# Patient Record
Sex: Female | Born: 1960 | Race: White | Hispanic: No | Marital: Married | Smoking: Never smoker
Health system: Southern US, Community
[De-identification: ages and names within clinical notes are randomized; demographics above are authoritative.]

## PROBLEM LIST (undated history)

## (undated) DIAGNOSIS — S83249A Other tear of medial meniscus, current injury, unspecified knee, initial encounter: Secondary | ICD-10-CM

## (undated) DIAGNOSIS — M1990 Unspecified osteoarthritis, unspecified site: Secondary | ICD-10-CM

## (undated) DIAGNOSIS — E039 Hypothyroidism, unspecified: Secondary | ICD-10-CM

## (undated) DIAGNOSIS — M419 Scoliosis, unspecified: Secondary | ICD-10-CM

## (undated) HISTORY — PX: CYSTOSCOPY: SUR368

## (undated) HISTORY — PX: BACK SURGERY: SHX140

---

## 1985-08-03 HISTORY — PX: CHOLECYSTECTOMY: SHX55

## 1997-11-26 ENCOUNTER — Other Ambulatory Visit: Admission: RE | Admit: 1997-11-26 | Discharge: 1997-11-26 | Payer: Self-pay | Admitting: Obstetrics & Gynecology

## 1998-07-10 ENCOUNTER — Inpatient Hospital Stay (HOSPITAL_COMMUNITY): Admission: AD | Admit: 1998-07-10 | Discharge: 1998-07-10 | Payer: Self-pay | Admitting: Obstetrics & Gynecology

## 1998-07-11 ENCOUNTER — Inpatient Hospital Stay (HOSPITAL_COMMUNITY): Admission: AD | Admit: 1998-07-11 | Discharge: 1998-07-13 | Payer: Self-pay | Admitting: Obstetrics and Gynecology

## 1998-12-02 ENCOUNTER — Other Ambulatory Visit: Admission: RE | Admit: 1998-12-02 | Discharge: 1998-12-02 | Payer: Self-pay | Admitting: Obstetrics and Gynecology

## 2000-01-15 ENCOUNTER — Other Ambulatory Visit: Admission: RE | Admit: 2000-01-15 | Discharge: 2000-01-15 | Payer: Self-pay | Admitting: Obstetrics and Gynecology

## 2000-04-21 ENCOUNTER — Ambulatory Visit (HOSPITAL_COMMUNITY): Admission: RE | Admit: 2000-04-21 | Discharge: 2000-04-21 | Payer: Self-pay | Admitting: Obstetrics and Gynecology

## 2000-04-21 ENCOUNTER — Encounter: Payer: Self-pay | Admitting: Obstetrics and Gynecology

## 2000-08-05 ENCOUNTER — Inpatient Hospital Stay (HOSPITAL_COMMUNITY): Admission: AD | Admit: 2000-08-05 | Discharge: 2000-08-09 | Payer: Self-pay | Admitting: Obstetrics and Gynecology

## 2000-08-10 ENCOUNTER — Encounter: Admission: RE | Admit: 2000-08-10 | Discharge: 2000-11-08 | Payer: Self-pay | Admitting: Obstetrics and Gynecology

## 2002-01-30 ENCOUNTER — Other Ambulatory Visit: Admission: RE | Admit: 2002-01-30 | Discharge: 2002-01-30 | Payer: Self-pay | Admitting: Obstetrics and Gynecology

## 2003-08-14 ENCOUNTER — Other Ambulatory Visit: Admission: RE | Admit: 2003-08-14 | Discharge: 2003-08-14 | Payer: Self-pay | Admitting: Obstetrics and Gynecology

## 2003-08-14 ENCOUNTER — Encounter: Admission: RE | Admit: 2003-08-14 | Discharge: 2003-08-14 | Payer: Self-pay | Admitting: Obstetrics and Gynecology

## 2004-10-13 ENCOUNTER — Other Ambulatory Visit: Admission: RE | Admit: 2004-10-13 | Discharge: 2004-10-13 | Payer: Self-pay | Admitting: Obstetrics and Gynecology

## 2009-10-29 ENCOUNTER — Encounter: Admission: RE | Admit: 2009-10-29 | Discharge: 2009-10-29 | Payer: Self-pay | Admitting: Family Medicine

## 2009-11-12 ENCOUNTER — Other Ambulatory Visit: Admission: RE | Admit: 2009-11-12 | Discharge: 2009-11-12 | Payer: Self-pay | Admitting: Family Medicine

## 2010-08-24 ENCOUNTER — Encounter: Payer: Self-pay | Admitting: Family Medicine

## 2010-10-22 ENCOUNTER — Other Ambulatory Visit: Payer: Self-pay | Admitting: Family Medicine

## 2010-10-22 DIAGNOSIS — Z1231 Encounter for screening mammogram for malignant neoplasm of breast: Secondary | ICD-10-CM

## 2010-11-10 ENCOUNTER — Ambulatory Visit
Admission: RE | Admit: 2010-11-10 | Discharge: 2010-11-10 | Disposition: A | Discharge status: Home / Self Care | Payer: BC Managed Care – PPO | Source: Ambulatory Visit | Attending: Family Medicine | Admitting: Family Medicine

## 2010-11-10 DIAGNOSIS — Z1231 Encounter for screening mammogram for malignant neoplasm of breast: Secondary | ICD-10-CM

## 2010-11-11 ENCOUNTER — Other Ambulatory Visit: Payer: Self-pay | Admitting: Family Medicine

## 2010-11-11 DIAGNOSIS — R928 Other abnormal and inconclusive findings on diagnostic imaging of breast: Secondary | ICD-10-CM

## 2010-11-12 ENCOUNTER — Ambulatory Visit
Admission: RE | Admit: 2010-11-12 | Discharge: 2010-11-12 | Disposition: A | Discharge status: Home / Self Care | Payer: BC Managed Care – PPO | Source: Ambulatory Visit | Attending: Family Medicine | Admitting: Family Medicine

## 2010-11-12 DIAGNOSIS — R928 Other abnormal and inconclusive findings on diagnostic imaging of breast: Secondary | ICD-10-CM

## 2010-11-17 ENCOUNTER — Other Ambulatory Visit: Payer: BC Managed Care – PPO

## 2010-12-19 NOTE — Discharge Summary (Signed)
[facility]  Patient:    Carly Patrick, Carly Patrick                   MRN: [id number] Adm. Date:  [id number] Disc. Date: [id number] Attending:  Osborn Coho Dictator:   Leilani Able, P.A.                           Discharge Summary  FINAL DIAGNOSES:              Intrauterine pregnancy at 56 6/[redacted] weeks gestation, history of prior cesarean section x 3, history of spinal surgery, patient is not a candidate for spinal anesthetic.  PROCEDURE:                    Repeat low transverse cesarean section.  SURGEON:                      Mark E. Dareen Piano, M.D.  ASSISTANT:                    Luvenia Redden, M.D.  ANESTHESIA:                   General endotracheal.  COMPLICATIONS:                None.  HISTORY:                      This 50 year old G5, P3-0-1-3 presents at 43 6/[redacted] weeks gestation for repeat cesarean section.  Patient has had a history of cesarean sections x 3.  Her prenatal course had been complicated by advanced maternal age.  Patient did decline amniocentesis but AFP was performed that was normal.  Patient also has history of spinal surgery and is not a candidate for spinal anesthesia.  She is admitted at this time for repeat cesarean section.  She is taken to the operating room on September 01, 2000 by Dr. Malva Limes where a repeat low transverse cesarean section is performed with the delivery of an 8 pound 11 ounce female infant with Apgars of 7 and 9.  Delivery went without complication.  Patients postoperative course was benign without significant fevers.  The baby was sent to the NICU for some breathing trouble. The baby was diagnosed with RDS.  By postoperative day #4 when patient was sent home the baby was stable.  She was sent home on a regular diet.  Told to decrease activities.  Told to continue prenatal vitamins.  Was given Tylox #30 one to two q.4h. as needed for pain.  Told to continue over-the-counter pain medicines and to  follow-up in the office in four weeks.  Patient is Rh negative, but did not receive RhoGAM because the baby was Rh negative as well. Baby, of course, is not discharged home with the mother. DD:  09/01/00 TD:  09/01/00 Job: [id number]

## 2010-12-19 NOTE — Op Note (Signed)
[facility]  Patient:    Carly Patrick, Carly Patrick                     MRN: [id number] Proc. Date: 08/05/00 Attending:  Janeece Riggers. Dareen Piano, M.D.                           Operative Report  PREOPERATIVE DIAGNOSES:       1. Intrauterine pregnancy at 38-6/7th weeks.                               2. History of prior cesarean section x 3.                               3. History of spinal surgery.  The patient                                  is not a candidate for spinal anesthetic.  POSTOPERATIVE DIAGNOSES:      1. Intrauterine pregnancy at 38-6/7th weeks.                               2. History of prior cesarean section x 3.                               3. History of spinal surgery.  The patient                                  is not a candidate for spinal anesthetic.  PROCEDURE:                    Repeat low transverse cesarean section.  SURGEON:                      Mark E. Dareen Piano, M.D.  ASSISTANT:                    Luvenia Redden, M.D.  ANESTHESIA:                   General endotracheal.  ESTIMATED BLOOD LOSS:         900 cc.  DRAINS:                       Foley to bedside drainage.  ANTIBIOTICS:                  Ancef 1 g.  SPECIMENS:                    None.  COMPLICATIONS:                None.  FINDINGS:                     The patient had normal-appearing fallopian tubes and ovaries.  The uterus appeared to be normal.  There was no evidence of any abdominal wall hernia.  DESCRIPTION OF PROCEDURE:     The patient was taken to the operating room where she  was prepped with Hibiclens and draped in the usual fashion for this procedure.  A Foley catheter was placed.  The patient then had a general endotracheal anesthetic administered.  A Pfannenstiel incision was made through the previous scar.  This was carried down to the fascia.  The fascia was entered in the midline and extended laterally.  The rectus muscles were then sharply dissected  from the fascia.  The rectus muscles were sharply divided in the middle, and taken superiorly and inferiorly.  The parietal peritoneum was entered sharply.  The bladder flap was taken down sharply.  A low transverse uterine incision was made in the midline and extended laterally.  The amniotic sac was entered sharply.  The amniotic fluid was clear.  The infant was delivered in the vertex presentation.  Once the delivery of the head was performed, the oropharynx and nostrils were bulb-suctioned.  The remaining infant was then delivered.  The cord was doubly clamped and cut, and the infant handed to the waiting NICU team.  The cord blood was then obtained.  The placenta was then manually removed.  The uterus was exteriorized.  The uterine cavity was wiped with a wet lap.  The uterine incision was closed in a single layer with #0 in a running locking fashion. There was a small extension on the right towards the cervix.  This was closed with the same suture.  The bladder flap was not closed.  The posterior cul-de-sac was suctioned.  The fallopian tubes and ovaries were examined, and appeared to be normal.  The uterus was placed back in the abdominal cavity. The abdominal gutters were wiped with a wet lap.  The bladder was inspected and appeared to be normal.  The parietal peritoneum and rectus muscles were reapproximated in the midline using #3-0 chromic in a running fashion.  The patient has been complaining of some right lower quadrant pain near the incision.  It was felt that the patient may have a hernia.  An extensive examination revealed no evidence of any abdominal wall defects or adhesions in this area.  At this point the fascia was closed using #0 Monocryl suture in a running fashion, beginning at each angle, and meeting in the midline.  The subcuticular tissue was made hemostatic with a Bovie.  The subcuticular tissue was closed with interrupted #2-0 plain gut sutures.  Stainless steel  clips were used to close the skin.  The patient tolerated the procedure well.  She was taken to the recovery room in stable condition.  The instrument and lap counts were correct x 2. DD:  08/05/00 TD:  08/05/00 Job: [id number]

## 2011-10-22 ENCOUNTER — Other Ambulatory Visit: Payer: Self-pay | Admitting: Family Medicine

## 2011-10-22 DIAGNOSIS — Z1231 Encounter for screening mammogram for malignant neoplasm of breast: Secondary | ICD-10-CM

## 2011-11-02 ENCOUNTER — Other Ambulatory Visit: Payer: Self-pay | Admitting: Family Medicine

## 2011-11-02 ENCOUNTER — Other Ambulatory Visit (HOSPITAL_COMMUNITY)
Admission: RE | Admit: 2011-11-02 | Discharge: 2011-11-02 | Disposition: A | Discharge status: Home / Self Care | Payer: BC Managed Care – PPO | Source: Ambulatory Visit | Attending: Family Medicine | Admitting: Family Medicine

## 2011-11-02 DIAGNOSIS — Z0000 Encounter for general adult medical examination without abnormal findings: Secondary | ICD-10-CM | POA: Insufficient documentation

## 2011-11-16 ENCOUNTER — Ambulatory Visit
Admission: RE | Admit: 2011-11-16 | Discharge: 2011-11-16 | Disposition: A | Discharge status: Home / Self Care | Payer: BC Managed Care – PPO | Source: Ambulatory Visit | Attending: Family Medicine | Admitting: Family Medicine

## 2011-11-16 DIAGNOSIS — Z1231 Encounter for screening mammogram for malignant neoplasm of breast: Secondary | ICD-10-CM

## 2011-12-03 ENCOUNTER — Encounter: Payer: Self-pay | Admitting: Family Medicine

## 2011-12-03 ENCOUNTER — Ambulatory Visit (INDEPENDENT_AMBULATORY_CARE_PROVIDER_SITE_OTHER): Payer: BC Managed Care – PPO | Admitting: Family Medicine

## 2011-12-03 VITALS — Ht 61.5 in | Wt 263.7 lb

## 2011-12-03 DIAGNOSIS — E785 Hyperlipidemia, unspecified: Secondary | ICD-10-CM | POA: Insufficient documentation

## 2011-12-03 DIAGNOSIS — R030 Elevated blood-pressure reading, without diagnosis of hypertension: Secondary | ICD-10-CM | POA: Insufficient documentation

## 2011-12-03 NOTE — Patient Instructions (Addendum)
-   Fat-free milk is the best choice for you.   - You have to do it yourself, AND you can't do it alone.    - Find a walking partner, adn set up a consistent time to walk with her.   - 16 oz of sweet tea provides ~45 g sugar.  AHA recommends women limit their added sugar intake to 25 grams a day (men = 37.5 g/day).    - 1 tsp sugar = 4 grams.   - TASTE PREFERENCES ARE LEARNED.  - Eating for non-hunger reasons:  HAALT:  Hungry, Angry, Anxious, Lonely, Tired.  (And Bored / Depressed.)   - Press the PAUSE button:  What do I really want?  - Physical activity:  Watch [employer] Truth about Exercise: [email or web address]  - Pay attn to especially:  Fayrene Fearing Levine's NEAT (non-exercise activity thermogenesis); and HIT (high-intensity training).    - How can you build in some NEAT? Goals:  1. Keep a daily food record.    2. Walk or do Zumba at least 30 min 5 X wk.  (Remember the 5-minute rule.)   - Determine your exercise days for the week ahead, AND PUT THEM ON THE CALENDAR.    3. Limit fluid intake to unsweetened beverages, water, & grapefruit juice IF it's consumed as part of the whole fruit.  (Try mixing grpfrt juice with seltzer.) - Track progress on your GOALS SHEET provided today.    - AT FOLLOW-UP, WE WILL REVIEW A PPT TALK ON EMOTIONAL EATING.

## 2011-12-03 NOTE — Progress Notes (Signed)
Medical Nutrition Therapy:  Appt start time: 0900 end time:  1000.  Assessment:  Primary concerns today: Weight management and hyperlipidemia, pre-DM, and pre-HTN.  Alayla was referred by Dr. Merri Brunette for hyperlipidemia and obesity mgmt.  Azyria is also pre-hypertensive.  She is a Museum/gallery exhibitions officer, and works from home, usually typing 5-6 hrs/day, often typing at night.  She has 4 children, the youngest of whom is 70.  Time constraints often get in the way of more exercise, but she admits that she is not always motivated to exercise more than the Zumba classes she does a couple times a week.  Tonesha's husband is morbidly obese, and shows little interest in health behavior changes.  Her two oldest boys are runners, who don't like skim milk, and often request foods like ice cream.  Velna has lost weight before (once >30 lb), but has never sustained healthy eating and activity behaviors.  She does seem highly motivated at this time, however.  She likes to cook, for which she does make time, and she seems to have a good knowledge of nutrition, although limited insight re. health behavior change processes.    Usual eating pattern includes 3 meals and 1-2 snacks per day.  Usual physical activity includes Zumba 2 X wk.  Ernestine has scoliosis for which she has had a spinal rod placed, so cannot run, but she recently got a book and MP3 player for walking.  She also has a recumbent bike and TM at home.    Everyday foods include 24-48 oz sweet tea (1.5 tbsp sugar/8 oz), hot green tea, eggs 3 X wk, apples, grapefruit.     24-hr recall:  B (8 AM)-   1 grapefruit, 1 1/2 c Raisin Bran, 2 c 1% milk, green tea,  Snk (10 AM)-   Yoplait light (90 kcal) L (1 PM)-  [place] sandwich on whlwht, provolone, let, mustard, Ital dressing, >1 c pretzels, 10 baby carrots, apple Snk ( PM)-  none D (6:30 PM)-  2 c soup (chx enchilada and a wild rice chx and minestrone), saltines, 1/2 grilled chs sandw, 5 oz sweet  tea Snk (9:30)-  small slice cake  Progress Towards Goal(s):  In progress.   Nutritional Diagnosis:  NB-2.1 Physical inactivity As related to poor motivation and time consttraints.  As evidenced by formal exercise only 2 X wk and sedentary job. NI-1.5 Excessive energy intake As related to expenditure.  As evidenced by BMI>49.    Intervention:  Nutrition education.  Monitoring/Evaluation:  Dietary intake, exercise, and body weight in 1 month(s).

## 2012-01-11 ENCOUNTER — Encounter: Payer: Self-pay | Admitting: Family Medicine

## 2012-01-11 ENCOUNTER — Ambulatory Visit (INDEPENDENT_AMBULATORY_CARE_PROVIDER_SITE_OTHER): Payer: BC Managed Care – PPO | Admitting: Family Medicine

## 2012-01-11 VITALS — Ht 61.5 in | Wt 262.3 lb

## 2012-01-11 DIAGNOSIS — R030 Elevated blood-pressure reading, without diagnosis of hypertension: Secondary | ICD-10-CM

## 2012-01-11 NOTE — Progress Notes (Signed)
Medical Nutrition Therapy:  Appt start time: 1000 end time:  1100.  Assessment:  Primary concerns today: Weight management and hyperlipidemia, pre-DM, and pre-HTN.  Carly Patrick got her TSH checked May 28, which was somewhat reduced (5.29), still on high end of normal.  Carly Patrick has done well with her goals of water instead of sweet tea, and she is wakling and doing Zumba.  She walks 90 minute 3 X wk, and 60 min Zumba 2 X wk.  She said she is feeling much better, and already noticed an increase in her stamina.   24-hr recall:  B ( AM)-   None (traveling back from [facility]) Brunch (10:30 AM)-  [employer] chx fried breast (skin removed), 1/2 biscuit w/ 1/2 tsp jelly, 1/4 c slaw, 1/2 c mashed potatoes w/ gravy, unsweetened tea L-    None; went to graduation at 1 PM Snk (3:30 PM)-  1 slice banana bread, 10-12 pecans, water  D (4:30 PM)-   1 hamburger patty, pickles, mustard, ketchup, water Snk (8 PM)-   1 c watermelon balls, 1 oatmeal cookie Yesterday was atypical b/c of driving home from family reunion, attending graduation, and church service at 6:30.    Progress Towards Goal(s):  In progress.   Nutritional Diagnosis:  Progress noted on both goals: NB-2.1 Physical inactivity As related to poor motivation and time consttraints.  As evidenced by Zumba 2 X wk and 90 min walking 3 X wk. NI-1.5 Excessive energy intake As related to expenditure.  As evidenced by wt loss of 1.4 lb.    Intervention:  Nutrition education.  Monitoring/Evaluation:  Dietary intake, exercise, and body weight in 1 month.

## 2012-01-11 NOTE — Patient Instructions (Signed)
-   MAKE FOOD A PRIORITY WHEREVER YOU GO.    - Plan for breakfast when traveling or when out of normal schedule, i.e., what can you bring from home?.  - Yogurt, peanut butter, whole-wheat bread/crackers, fresh fruit, string cheese.   - Scientist, research (physical sciences) for acces to emotional eating ppt on [employer]. - Great job on your goals so far.  Try to get more consistent with your food record, and continue completing goals sheet.    - Keeping a food record will help you monitor what, how much, and (importantly:) WHEN you eat.   - ADDITIONAL GOAL:  Plan ahead for meals and snacks, especially when the schedule is extra busy.

## 2012-02-15 ENCOUNTER — Ambulatory Visit: Payer: BC Managed Care – PPO | Admitting: Family Medicine

## 2012-03-28 ENCOUNTER — Ambulatory Visit (INDEPENDENT_AMBULATORY_CARE_PROVIDER_SITE_OTHER): Payer: BC Managed Care – PPO | Admitting: Family Medicine

## 2012-03-28 ENCOUNTER — Encounter: Payer: Self-pay | Admitting: Family Medicine

## 2012-03-28 VITALS — Ht 61.5 in | Wt 262.6 lb

## 2012-03-28 DIAGNOSIS — E785 Hyperlipidemia, unspecified: Secondary | ICD-10-CM

## 2012-03-28 DIAGNOSIS — R030 Elevated blood-pressure reading, without diagnosis of hypertension: Secondary | ICD-10-CM

## 2012-03-28 NOTE — Patient Instructions (Addendum)
-   You need more sleep!  Is there a way to pass on some of your responsibilities to someone else?  - Give some thought to what you can let go of, but not those activities you especially enjoy.    - Remember that no one knows how you feel unless you tell them.    - Saying NO:  "I promised my family I would not take on one more project."  Or "I would love to help, but am swamped at this time.  Please keep me on your list to call in the future."  - Or at least promise yourself that you will not say yes to ANYTHING without discussing it with your husband.    - Yesterday's food intake:  - NOT ENOUGH FOOD!  You may have been able to resist cake and punch had you eaten adequate breakfast and lunch.    - NOT ENOUGH VEGETABLES!  No fruit!  - Plan ahead for meals and snacks, considering what is on the schedule for the day.   - Goals:  1. Keep a daily food record.    2. Walk or do Zumba at least 30 min 4 X wk.  (Remember the 5-minute rule.)   3. Limit fluid intake to unsweetened beverages and water.  - Let me know if you would like to take the Weigh to Wellness Class that starts Sept 10 (5:30-6:30 PM at [facility]).

## 2012-03-28 NOTE — Progress Notes (Signed)
Medical Nutrition Therapy:  Appt start time: 1000 end time:  1100.  Assessment:  Primary concerns today: Weight management and hyperlipidemia, pre-DM, and pre-HTN.  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  She has done well with her water intake and unsweetened tea.  She has also been bicycling some, and is still doing a Zumba class 1 X wk, and has walked ~45 min 1 X wk.  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  It sounds like Carly Patrick's family is very dependent on her for day-to-day functioning.    24-hr recall:  Up ~9 AM B (9:30 AM)-  1/2 chx breast, water Snk ( AM)-  none L ( PM)-  1/2 chx breast, water Snk (4 PM)-  Slice of cake, 12 oz punch (at church concert reception) D (7:30 PM)-  1/2 c spaghetti w/ marinara, salad w/ 1 tbsp dressing, 1/2 grilled cheese sandw, water Snk ( PM)-  none  Progress Towards Goal(s):  In progress.   Nutritional Diagnosis:    NB-2.1 Physical inactivity As related to poor motivation and time consttraints.  As evidenced by Zumba 2 X wk and 90 min walking 3 X wk. NI-1.5 Excessive energy intake As related to expenditure.  As evidenced by wt loss of 1.4 lb.    Intervention:  Nutrition education.  Monitoring/Evaluation:  Dietary intake, exercise, and body weight in 1 month.

## 2012-05-02 ENCOUNTER — Encounter: Payer: Self-pay | Admitting: Family Medicine

## 2012-05-02 ENCOUNTER — Ambulatory Visit (INDEPENDENT_AMBULATORY_CARE_PROVIDER_SITE_OTHER): Payer: BC Managed Care – PPO | Admitting: Family Medicine

## 2012-05-02 VITALS — Ht 61.5 in | Wt 257.4 lb

## 2012-05-02 DIAGNOSIS — E785 Hyperlipidemia, unspecified: Secondary | ICD-10-CM

## 2012-05-02 DIAGNOSIS — R030 Elevated blood-pressure reading, without diagnosis of hypertension: Secondary | ICD-10-CM

## 2012-05-02 DIAGNOSIS — E66813 Obesity, class 3: Secondary | ICD-10-CM

## 2012-05-02 NOTE — Progress Notes (Signed)
Medical Nutrition Therapy:  Appt start time: 1000 end time:  1100.  Assessment:  Primary concerns today: Weight management and hyperlipidemia, pre-DM, and pre-HTN.  Carly Patrick has been keeping up with her goals sheet and in taking care of herself.  She is getting more sleep, is exercising more than her goal of 4 X wk, keeping a food record daily, and she has been limiting beverages to water and unsweetened tea (not even diet drinks).  She has been making an effort to eat more protein and to limit carb's.   Carly Patrick would like to increase her exercise intensity, and is motivated to work hard.    24-hr recall:  B (9 AM)-  1 fried egg (in spray oil), 10 grapes, 1/2 orange, green tea Snk ( AM)-  none L (12:45 PM)-  Large salad w/ balsamic vinegar, 1 c spaghetti w/ meat sauce, water Snk (2:30)-  Activia yogurt, 10 almonds Snk (6:30)-  Handful of popcorn, water, unsweetenedtea D (7:45 PM)-  Salad, [place], tomato, cel, car, cuc, onion, shredded chs, 4 saltines, water Snk ( PM)-  none  Progress Towards Goal(s):  In progress.   Nutritional Diagnosis:  Excellent progress on both goals:  NB-2.1 Physical inactivity As related to time constraints.  As evidenced by Zumba 2 X wk and 90 min walking at least 3 X wk. NI-1.5 Excessive energy intake As related to expenditure.  As evidenced by wt loss of >5 lb.    Intervention:  Nutrition education.  Monitoring/Evaluation:  Dietary intake, exercise, and body weight in 1 month.

## 2012-05-02 NOTE — Patient Instructions (Addendum)
-   Look into finding a tennis partner, and setting up a consistent time to play.   - Increase exercise intensity on days you walk, i.e., 30-sec bursts.  Have a workout plan in mind before you start.   - Continue to keep daily food records, and to complete goals sheet.  - Call or email for Dec appt:  [phone] / [email or web address].  - If you decide you want an Oct appt, call asap to schedule.

## 2013-10-10 ENCOUNTER — Other Ambulatory Visit: Payer: Self-pay

## 2013-10-10 DIAGNOSIS — Z1231 Encounter for screening mammogram for malignant neoplasm of breast: Secondary | ICD-10-CM

## 2013-10-30 ENCOUNTER — Ambulatory Visit
Admission: RE | Admit: 2013-10-30 | Discharge: 2013-10-30 | Disposition: A | Discharge status: Home / Self Care | Payer: Self-pay | Source: Ambulatory Visit

## 2013-10-30 DIAGNOSIS — Z1231 Encounter for screening mammogram for malignant neoplasm of breast: Secondary | ICD-10-CM

## 2014-05-23 ENCOUNTER — Other Ambulatory Visit: Payer: Self-pay | Admitting: Family Medicine

## 2014-05-23 ENCOUNTER — Other Ambulatory Visit (HOSPITAL_COMMUNITY)
Admission: RE | Admit: 2014-05-23 | Discharge: 2014-05-23 | Disposition: A | Discharge status: Home / Self Care | Payer: BC Managed Care – PPO | Source: Ambulatory Visit | Attending: Family Medicine | Admitting: Family Medicine

## 2014-05-23 DIAGNOSIS — Z0000 Encounter for general adult medical examination without abnormal findings: Secondary | ICD-10-CM | POA: Diagnosis not present

## 2014-05-25 LAB — CYTOLOGY - PAP

## 2015-08-09 ENCOUNTER — Other Ambulatory Visit: Payer: Self-pay

## 2015-08-09 DIAGNOSIS — Z1231 Encounter for screening mammogram for malignant neoplasm of breast: Secondary | ICD-10-CM

## 2015-09-16 ENCOUNTER — Ambulatory Visit
Admission: RE | Admit: 2015-09-16 | Discharge: 2015-09-16 | Disposition: A | Discharge status: Home / Self Care | Payer: BC Managed Care – PPO | Source: Ambulatory Visit

## 2015-09-16 DIAGNOSIS — Z1231 Encounter for screening mammogram for malignant neoplasm of breast: Secondary | ICD-10-CM

## 2016-08-10 ENCOUNTER — Other Ambulatory Visit: Payer: Self-pay | Admitting: Family Medicine

## 2016-08-10 ENCOUNTER — Other Ambulatory Visit (HOSPITAL_COMMUNITY)
Admission: RE | Admit: 2016-08-10 | Discharge: 2016-08-10 | Disposition: A | Discharge status: Home / Self Care | Payer: BC Managed Care – PPO | Source: Ambulatory Visit | Attending: Family Medicine | Admitting: Family Medicine

## 2016-08-10 DIAGNOSIS — Z01419 Encounter for gynecological examination (general) (routine) without abnormal findings: Secondary | ICD-10-CM | POA: Diagnosis not present

## 2016-08-12 LAB — CYTOLOGY - PAP
ADEQUACY: ABSENT
Diagnosis: NEGATIVE

## 2016-08-24 ENCOUNTER — Other Ambulatory Visit: Payer: Self-pay | Admitting: Family Medicine

## 2016-08-24 DIAGNOSIS — S32020S Wedge compression fracture of second lumbar vertebra, sequela: Secondary | ICD-10-CM

## 2016-10-05 ENCOUNTER — Ambulatory Visit
Admission: RE | Admit: 2016-10-05 | Discharge: 2016-10-05 | Disposition: A | Discharge status: Home / Self Care | Payer: BC Managed Care – PPO | Source: Ambulatory Visit | Attending: Family Medicine | Admitting: Family Medicine

## 2016-10-05 DIAGNOSIS — S32020S Wedge compression fracture of second lumbar vertebra, sequela: Secondary | ICD-10-CM

## 2016-10-20 ENCOUNTER — Encounter (HOSPITAL_COMMUNITY): Payer: Self-pay | Admitting: *Deleted

## 2016-10-22 ENCOUNTER — Ambulatory Visit (HOSPITAL_COMMUNITY): Payer: BC Managed Care – PPO | Admitting: Anesthesiology

## 2016-10-22 ENCOUNTER — Ambulatory Visit (HOSPITAL_COMMUNITY)
Admission: RE | Admit: 2016-10-22 | Discharge: 2016-10-22 | Disposition: A | Discharge status: Home / Self Care | Payer: BC Managed Care – PPO | Source: Ambulatory Visit | Attending: Gastroenterology | Admitting: Gastroenterology

## 2016-10-22 ENCOUNTER — Encounter (HOSPITAL_COMMUNITY)
Admission: RE | Disposition: A | Discharge status: Home / Self Care | Payer: Self-pay | Source: Ambulatory Visit | Attending: Gastroenterology

## 2016-10-22 ENCOUNTER — Encounter (HOSPITAL_COMMUNITY): Payer: Self-pay | Admitting: Gastroenterology

## 2016-10-22 DIAGNOSIS — Z9049 Acquired absence of other specified parts of digestive tract: Secondary | ICD-10-CM | POA: Insufficient documentation

## 2016-10-22 DIAGNOSIS — D122 Benign neoplasm of ascending colon: Secondary | ICD-10-CM | POA: Diagnosis not present

## 2016-10-22 DIAGNOSIS — E039 Hypothyroidism, unspecified: Secondary | ICD-10-CM | POA: Insufficient documentation

## 2016-10-22 DIAGNOSIS — Z6841 Body Mass Index (BMI) 40.0 and over, adult: Secondary | ICD-10-CM | POA: Insufficient documentation

## 2016-10-22 DIAGNOSIS — M1990 Unspecified osteoarthritis, unspecified site: Secondary | ICD-10-CM | POA: Insufficient documentation

## 2016-10-22 DIAGNOSIS — Z79899 Other long term (current) drug therapy: Secondary | ICD-10-CM | POA: Diagnosis not present

## 2016-10-22 DIAGNOSIS — Z1211 Encounter for screening for malignant neoplasm of colon: Secondary | ICD-10-CM | POA: Insufficient documentation

## 2016-10-22 DIAGNOSIS — K5730 Diverticulosis of large intestine without perforation or abscess without bleeding: Secondary | ICD-10-CM | POA: Diagnosis not present

## 2016-10-22 DIAGNOSIS — M419 Scoliosis, unspecified: Secondary | ICD-10-CM | POA: Insufficient documentation

## 2016-10-22 HISTORY — DX: Hypothyroidism, unspecified: E03.9

## 2016-10-22 HISTORY — DX: Other tear of medial meniscus, current injury, unspecified knee, initial encounter: S83.249A

## 2016-10-22 HISTORY — DX: Scoliosis, unspecified: M41.9

## 2016-10-22 HISTORY — PX: COLONOSCOPY WITH PROPOFOL: SHX5780

## 2016-10-22 HISTORY — DX: Unspecified osteoarthritis, unspecified site: M19.90

## 2016-10-22 SURGERY — COLONOSCOPY WITH PROPOFOL
Anesthesia: Monitor Anesthesia Care

## 2016-10-22 MED ORDER — PROPOFOL 10 MG/ML IV BOLUS
INTRAVENOUS | Status: AC
  Filled 2016-10-22: qty 20

## 2016-10-22 MED ORDER — ONDANSETRON HCL 4 MG/2ML IJ SOLN
INTRAMUSCULAR | Status: DC | PRN
  Administered 2016-10-22: 4 mg via INTRAVENOUS

## 2016-10-22 MED ORDER — PROPOFOL 500 MG/50ML IV EMUL
INTRAVENOUS | Status: DC | PRN
  Administered 2016-10-22: 200 ug/kg/min via INTRAVENOUS

## 2016-10-22 MED ORDER — SODIUM CHLORIDE 0.9 % IV SOLN: INTRAVENOUS | Status: DC

## 2016-10-22 MED ORDER — PROPOFOL 10 MG/ML IV BOLUS
INTRAVENOUS | Status: DC | PRN
  Administered 2016-10-22: 20 mg via INTRAVENOUS

## 2016-10-22 MED ORDER — PROPOFOL 10 MG/ML IV BOLUS
INTRAVENOUS | Status: AC
  Filled 2016-10-22: qty 40

## 2016-10-22 MED ORDER — EPHEDRINE 5 MG/ML INJ
INTRAVENOUS | Status: AC
  Filled 2016-10-22: qty 10

## 2016-10-22 MED ORDER — LACTATED RINGERS IV SOLN
INTRAVENOUS | Status: DC
  Administered 2016-10-22: 1000 mL via INTRAVENOUS

## 2016-10-22 MED ORDER — ONDANSETRON HCL 4 MG/2ML IJ SOLN
INTRAMUSCULAR | Status: AC
  Filled 2016-10-22: qty 2

## 2016-10-22 SURGICAL SUPPLY — 21 items

## 2016-10-22 NOTE — H&P (Signed)
Carly Patrick is an 56 y.o. female.   Chief Complaint:  Screening for colon cancer HPI: Morbidly obese pt w/ no risk factors or LGI sx presents for initial scr colonoscopy  Past Medical History:  Diagnosis Date  . Acute medial meniscus tear    right knee pt to bring pilloe per dr Dealva Lafoy to help  . Arthritis   . Hypothyroidism   . Scoliosis     Past Surgical History:  Procedure Laterality Date  . BACK SURGERY     muliple back surgeries harrington rods placed  . CESAREAN SECTION     x 4  . CHOLECYSTECTOMY  1987  . 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 No pertinent family history. Social History:  reports that she has never smoked. She has never used smokeless tobacco. She reports that she does not drink alcohol or use drugs.  Allergies: No Known Allergies  Medications Prior to Admission  Medication Sig Dispense Refill  . acetaminophen (TYLENOL) 650 MG CR tablet Take 1,300 mg by mouth 2 (two) times daily.    . cetirizine (ZYRTEC) 10 MG tablet Take 10 mg by mouth daily.    . Glucosamine-Chondroitin 750-600 MG TABS Take 1 tablet by mouth 2 (two) times daily.    Marland Kitchen levothyroxine (SYNTHROID, LEVOTHROID) 100 MCG tablet Take 100 mcg by mouth daily before breakfast.    . pyridoxine (B-6) 100 MG tablet Take 100 mg by mouth daily.    . Turmeric 500 MG CAPS Take 1 capsule by mouth daily.    Marland Kitchen triamcinolone cream (KENALOG) 0.1 % Apply 1 application topically 2 (two) times daily as needed (eczema).      No results found for this or any previous visit (from the past 48 hour(s)). No results found.  ROS no chronic cardiopulm sx, but does have bad knee  Blood pressure (!) 170/75, pulse 97, temperature 98 F (36.7 C), temperature source Oral, resp. rate (!) 26, height 5' 1.5" (1.562 m), weight 118.8 kg (262 lb), last menstrual period 01/02/2015, SpO2 97 %. Physical Exam   Pleasant, cognitively intact, NAD, chest clr, heart nl, abd obese but  NT  Assessment/Plan Appropriate for colonoscopy, which will be done at hospital b/o high BMI.  Cleotis Nipper, MD 10/22/2016, 10:41 AM

## 2016-10-22 NOTE — Discharge Instructions (Signed)

## 2016-10-22 NOTE — Anesthesia Preprocedure Evaluation (Signed)
Anesthesia Evaluation  Patient identified by MRN, date of birth, ID band Patient awake    Reviewed: Allergy & Precautions, NPO status , Patient's Chart, lab work & pertinent test results  Airway Mallampati: I  TM Distance: >3 FB Neck ROM: Full    Dental  (+) Teeth Intact   Pulmonary neg pulmonary ROS,    breath sounds clear to auscultation       Cardiovascular negative cardio ROS   Rhythm:Regular Rate:Normal     Neuro/Psych    GI/Hepatic negative GI ROS, Neg liver ROS,   Endo/Other  Hypothyroidism Morbid obesity  Renal/GU negative Renal ROS     Musculoskeletal  (+) Arthritis ,   Abdominal   Peds  Hematology   Anesthesia Other Findings   Reproductive/Obstetrics                             Anesthesia Physical Anesthesia Plan  ASA: II  Anesthesia Plan: MAC   Post-op Pain Management:    Induction:   Airway Management Planned: Natural Airway and Simple Face Mask  Additional Equipment:   Intra-op Plan:   Post-operative Plan:   Informed Consent: I have reviewed the patients History and Physical, chart, labs and discussed the procedure including the risks, benefits and alternatives for the proposed anesthesia with the patient or authorized representative who has indicated his/her understanding and acceptance.   Dental advisory given  Plan Discussed with: CRNA  Anesthesia Plan Comments:         Anesthesia Quick Evaluation

## 2016-10-22 NOTE — Transfer of Care (Signed)
Immediate Anesthesia Transfer of Care Note  Patient: Carly Patrick  Procedure(s) Performed: Procedure(s): COLONOSCOPY WITH PROPOFOL (N/A)  Patient Location: PACU  Anesthesia Type:MAC  Level of Consciousness:  sedated, patient cooperative and responds to stimulation  Airway & Oxygen Therapy:Patient Spontanous Breathing and Patient connected to face mask oxgen  Post-op Assessment:  Report given to PACU RN and Post -op Vital signs reviewed and stable  Post vital signs:  Reviewed and stable  Last Vitals:  Vitals:   10/22/16 1034  BP: (!) 170/75  Pulse: 97  Resp: (!) 26  Temp: 33.8 C    Complications: No apparent anesthesia complications

## 2016-10-22 NOTE — Op Note (Signed)
[facility] Patient Name: Carly Patrick Procedure Date: 10/22/2016 MRN: [id number] Attending MD: Ronald Lobo , MD Date of Birth: 25-Oct-1960 CSN: [id number] Age: 56 Admit Type: Outpatient Procedure:                Colonoscopy Indications:              Screening for colorectal malignant neoplasm, This                            is the patient's first colonoscopy Providers:                Ronald Lobo, MD, Elmer Ramp. Tilden Dome, RN, Corliss Parish, Technician Referring MD:              Medicines:                Monitored Anesthesia Care Complications:            No immediate complications. Estimated Blood Loss:     Estimated blood loss was minimal. Procedure:                Pre-Anesthesia Assessment:                           - Prior to the procedure, a History and Physical                            was performed, and patient medications and                            allergies were reviewed. The patient's tolerance of                            previous anesthesia was also reviewed. The risks                            and benefits of the procedure and the sedation                            options and risks were discussed with the patient.                            All questions were answered, and informed consent                            was obtained. Prior Anticoagulants: The patient has                            taken no previous anticoagulant or antiplatelet                            agents. ASA Grade Assessment: III - A patient with  severe systemic disease. After reviewing the risks                            and benefits, the patient was deemed in                            satisfactory condition to undergo the procedure.                           After obtaining informed consent, the colonoscope                            was passed under direct vision. Throughout the   procedure, the patient's blood pressure, pulse, and                            oxygen saturations were monitored continuously. The                            EC-[id number] ([id number]) scope was introduced through                            the anus and advanced to the the cecum, identified                            by appendiceal orifice and ileocecal valve. The                            colonoscopy was somewhat difficult due to                            significant looping. Successful completion of the                            procedure was aided by using manual pressure. The                            patient tolerated the procedure well. The quality                            of the bowel preparation was excellent. The                            appendiceal orifice and the rectum were                            photographed. Scope In: 11:49:00 AM Scope Out: 24:23:53 PM Scope Withdrawal Time: 0 hours 12 minutes 16 seconds  Total Procedure Duration: 0 hours 19 minutes 49 seconds  Findings:      A 4 mm polyp was found in the proximal ascending colon. The polyp was       sessile. The polyp was removed with a cold snare. Resection and       retrieval were complete. Estimated blood loss was minimal.      Multiple  small-mouthed diverticula were found in the sigmoid colon.      No other significant abnormalities were identified in a careful       examination of the remainder of the colon.      The retroflexed view of the distal rectum and anal verge was normal and       showed no anal or rectal abnormalities. Impression:               - One 4 mm polyp in the proximal ascending colon,                            removed with a cold snare. Resected and retrieved.                           - Diverticulosis in the sigmoid colon.                           - The distal rectum and anal verge are normal on                            retroflexion view. Moderate Sedation:      This patient was sedated  with monitored anesthesia care, not moderate       sedation. Recommendation:           - Await pathology results.                           - If the pathology report reveals adenomatous                            tissue, then repeat the colonoscopy for                            surveillance in 5 years.                           - If the pathology report reveals no adenomatous                            tissue, then repeat the colonoscopy for screening                            purposes in 10 years.                           - Resume previous diet.                           - Continue present medications. Procedure Code(s):        --- Professional ---                           [phone], Colonoscopy, flexible; with removal of                            tumor(s), polyp(s), or other lesion(s) by snare  technique Diagnosis Code(s):        --- Professional ---                           Z12.11, Encounter for screening for malignant                            neoplasm of colon                           D12.2, Benign neoplasm of ascending colon CPT copyright 2016 American Medical Association. All rights reserved. The codes documented in this report are preliminary and upon coder review may  be revised to meet current compliance requirements. Ronald Lobo, MD 10/22/2016 12:21:58 PM This report has been signed electronically. Number of Addenda: 0

## 2016-10-25 ENCOUNTER — Encounter (HOSPITAL_COMMUNITY): Payer: Self-pay | Admitting: Gastroenterology

## 2016-10-28 NOTE — Anesthesia Postprocedure Evaluation (Addendum)
Anesthesia Post Note  Patient: Carly Patrick  Procedure(s) Performed: Procedure(s) (LRB): COLONOSCOPY WITH PROPOFOL (N/A)  Patient location during evaluation: Endoscopy Anesthesia Type: MAC Level of consciousness: awake and alert Pain management: pain level controlled Vital Signs Assessment: post-procedure vital signs reviewed and stable Respiratory status: spontaneous breathing, nonlabored ventilation, respiratory function stable and patient connected to nasal cannula oxygen Cardiovascular status: stable and blood pressure returned to baseline Anesthetic complications: no       Last Vitals:  Vitals:   10/22/16 1230 10/22/16 1240  BP: (!) 149/87 (!) 131/55  Pulse: 71 72  Resp: 16 (!) 25  Temp:      Last Pain:  Vitals:   10/22/16 1216  TempSrc: Oral                 [name],JAMES TERRILL

## 2017-01-01 NOTE — Addendum Note (Signed)
Addendum  created 01/01/17 1249 by Rica Koyanagi, MD   Sign clinical note

## 2017-09-07 ENCOUNTER — Other Ambulatory Visit: Payer: Self-pay | Admitting: Family Medicine

## 2017-09-07 DIAGNOSIS — Z139 Encounter for screening, unspecified: Secondary | ICD-10-CM

## 2017-10-06 ENCOUNTER — Ambulatory Visit
Admission: RE | Admit: 2017-10-06 | Discharge: 2017-10-06 | Disposition: A | Discharge status: Home / Self Care | Payer: BC Managed Care – PPO | Source: Ambulatory Visit | Attending: Family Medicine | Admitting: Family Medicine

## 2017-10-06 DIAGNOSIS — Z139 Encounter for screening, unspecified: Secondary | ICD-10-CM

## 2018-09-07 ENCOUNTER — Other Ambulatory Visit: Payer: Self-pay | Admitting: Family Medicine

## 2018-09-07 DIAGNOSIS — Z1231 Encounter for screening mammogram for malignant neoplasm of breast: Secondary | ICD-10-CM

## 2018-09-14 ENCOUNTER — Other Ambulatory Visit (HOSPITAL_COMMUNITY)
Admission: RE | Admit: 2018-09-14 | Discharge: 2018-09-14 | Disposition: A | Discharge status: Home / Self Care | Payer: BC Managed Care – PPO | Source: Ambulatory Visit | Attending: Family Medicine | Admitting: Family Medicine

## 2018-09-14 ENCOUNTER — Other Ambulatory Visit: Payer: Self-pay | Admitting: Family Medicine

## 2018-09-14 DIAGNOSIS — Z124 Encounter for screening for malignant neoplasm of cervix: Secondary | ICD-10-CM | POA: Diagnosis present

## 2018-09-15 LAB — CYTOLOGY - PAP
ADEQUACY: ABSENT
Diagnosis: NEGATIVE
HPV: NOT DETECTED

## 2018-10-25 ENCOUNTER — Ambulatory Visit: Payer: BC Managed Care – PPO

## 2018-11-28 ENCOUNTER — Ambulatory Visit: Payer: BC Managed Care – PPO

## 2019-01-16 ENCOUNTER — Ambulatory Visit
Admission: RE | Admit: 2019-01-16 | Discharge: 2019-01-16 | Disposition: A | Discharge status: Home / Self Care | Payer: BC Managed Care – PPO | Source: Ambulatory Visit | Attending: Family Medicine | Admitting: Family Medicine

## 2019-01-16 ENCOUNTER — Other Ambulatory Visit: Payer: Self-pay

## 2019-01-16 DIAGNOSIS — Z1231 Encounter for screening mammogram for malignant neoplasm of breast: Secondary | ICD-10-CM

## 2019-12-18 ENCOUNTER — Other Ambulatory Visit: Payer: Self-pay | Admitting: Family Medicine

## 2019-12-18 DIAGNOSIS — Z1231 Encounter for screening mammogram for malignant neoplasm of breast: Secondary | ICD-10-CM

## 2020-01-17 ENCOUNTER — Other Ambulatory Visit: Payer: Self-pay

## 2020-01-17 ENCOUNTER — Ambulatory Visit
Admission: RE | Admit: 2020-01-17 | Discharge: 2020-01-17 | Disposition: A | Discharge status: Home / Self Care | Payer: BC Managed Care – PPO | Source: Ambulatory Visit | Attending: Family Medicine | Admitting: Family Medicine

## 2020-01-17 DIAGNOSIS — Z1231 Encounter for screening mammogram for malignant neoplasm of breast: Secondary | ICD-10-CM

## 2020-12-05 ENCOUNTER — Other Ambulatory Visit: Payer: Self-pay | Admitting: Family Medicine

## 2020-12-05 DIAGNOSIS — Z1231 Encounter for screening mammogram for malignant neoplasm of breast: Secondary | ICD-10-CM

## 2021-01-29 ENCOUNTER — Other Ambulatory Visit: Payer: Self-pay

## 2021-01-29 ENCOUNTER — Ambulatory Visit
Admission: RE | Admit: 2021-01-29 | Discharge: 2021-01-29 | Disposition: A | Discharge status: Home / Self Care | Payer: PRIVATE HEALTH INSURANCE | Source: Ambulatory Visit | Attending: Family Medicine | Admitting: Family Medicine

## 2021-01-29 DIAGNOSIS — Z1231 Encounter for screening mammogram for malignant neoplasm of breast: Secondary | ICD-10-CM

## 2021-07-07 ENCOUNTER — Telehealth: Payer: Self-pay | Admitting: Genetic Counselor

## 2021-07-07 NOTE — Telephone Encounter (Signed)
Error

## 2021-07-30 ENCOUNTER — Other Ambulatory Visit: Payer: PRIVATE HEALTH INSURANCE

## 2021-07-30 ENCOUNTER — Encounter: Payer: PRIVATE HEALTH INSURANCE | Admitting: Genetic Counselor

## 2021-08-13 ENCOUNTER — Encounter: Payer: Self-pay | Admitting: Genetic Counselor

## 2021-08-13 ENCOUNTER — Other Ambulatory Visit: Payer: Self-pay

## 2021-08-13 ENCOUNTER — Inpatient Hospital Stay: Payer: BC Managed Care – PPO

## 2021-08-13 ENCOUNTER — Inpatient Hospital Stay: Payer: BC Managed Care – PPO | Attending: Family Medicine | Admitting: Genetic Counselor

## 2021-08-13 DIAGNOSIS — Z803 Family history of malignant neoplasm of breast: Secondary | ICD-10-CM | POA: Diagnosis not present

## 2021-08-13 DIAGNOSIS — Z8481 Family history of carrier of genetic disease: Secondary | ICD-10-CM | POA: Diagnosis not present

## 2021-08-13 LAB — GENETIC SCREENING ORDER

## 2021-08-13 NOTE — Progress Notes (Signed)
REFERRING PROVIDER: Carol Ada, MD [place],  [place] [address]  PRIMARY PROVIDER:  Carol Ada, MD  PRIMARY REASON FOR VISIT:  Encounter Diagnoses  Name Primary?   Family history of gene mutation    Family history of breast cancer     HISTORY OF PRESENT ILLNESS:   Carly Patrick, a 61 y.o. female, was seen for a [facility] cancer genetics consultation at the request of Dr. Tamala Julian due to a family history of a gene mutation (MITF).  Carly Patrick presents to clinic today to discuss the possibility of a hereditary predisposition to cancer, to discuss genetic testing, and to further clarify her future cancer risks, as well as potential cancer risks for family members.   Carly Patrick is a 61 y.o. female with no personal history of cancer.    CANCER HISTORY:  Oncology History   No history exists.    RISK FACTORS:  Menarche was at age 76.  First live birth at age 60.  Ovaries intact: yes.  Uterus intact: yes.  Menopausal status: postmenopausal, menopause at age 55 HRT use: 0 years. Mammogram within the last year: yes. Number of breast biopsies: 0.  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    Social History   Socioeconomic History   Marital status: Married    Spouse name: Not on file   Number of children: Not on file   Years of education: Not on file   Highest education level: Not on file  Occupational History    Not on file  Tobacco Use   Smoking status: Never   Smokeless tobacco: Never  Substance and Sexual Activity   Alcohol use: No   Drug use: No   Sexual activity: Not on file  Other Topics Concern   Not on file  Social History Narrative   Not on file   Social Determinants of Health   Financial Resource Strain: Not on file  Food Insecurity: Not on file  Transportation Needs: Not on file  Physical Activity: Not on file  Stress: Not on file  Social Connections: Not on file     FAMILY HISTORY:  We obtained a detailed, 4-generation family history.  Significant diagnoses are listed below: Family History  Problem Relation Age of Onset   Breast cancer Mother 79       genetic testing identified a mutation in MITF   Melanoma Mother        dx. 39s   Melanoma Father    Breast cancer Maternal Aunt        dx. 50s   Liver cancer Paternal Grandfather    Breast cancer Cousin 77   Kidney cancer Cousin    Breast cancer Cousin 94       reports negative genetic testing      Carly Patrick's mother was  diagnosed with melanoma in her 30s and breast cancer at age 63. She had hereditary cancer genetic testing and was found to have a MITF gene mutation. Carly Patrick maternal aunt was diagnosed with breast cancer in her 55s, she died at 61. This aunt's son was diagnosed with kidney cancer. The daughter of a different maternal aunt was diagnosed with breast cancer at age 22. Carly Patrick father was diagnosed with melanoma. Her paternal cousin was diagnosed with breast cancer at age 53 and reportedly had negative genetic testing. Her paternal grandfather was diagnosed with liver cancer, he is deceased.    GENETIC COUNSELING ASSESSMENT: Carly Patrick is a 61 y.o. female with a family history of a MITF gene mutation. We, therefore, discussed and recommended the following at today's visit.   DISCUSSION:  Carly Patrick mother tested positive for a single pathogenic variant in the MITF gene. The MITF gene is  associated with an increased risk for melanoma and possible renal cell carcinoma. Of note, her mother also had a variant of uncertain significance in the [facility] and GALNT12 genes. Family members are not recommended to have genetic testing for the variants of uncertain significance.    MITF Cancer Risks: 2-8 fold increased risk for melanoma Up to a 5-fold increased risk for renal cell carcinoma (RCC)   Clinical Information: Malignant melanoma is a neoplasm, or cancer, of melanocytes, the cells that produce pigment. Melanoma most often occurs in the skin, but may also affect the eyes, ears, gastrointestinal tract, and oral and genital membranes. Cutaneous melanoma is considered the most lethal skin cancer if not detected and treated during its early stages (PMID: 16010932). Approximately 5-10% of cases are familial (PMID: 35573220). The c.952G>A (p.Glu318Lys) variant in MITF, also known as E318K, is associated with an increased risk of melanoma (PMID: 25427062, 37628315, 17616073, 71062694, 85462703, 50093818, 29937169). The risks are not yet established; however, studies suggest the risk may be 2- to 8-fold higher than the general population risk (PMID: 67893810, 17510258). This variant has been associated with features including high nevi count (>200), fair skin, non-blue eye color, and early-onset melanoma (under age 86) (PMID: 52778242, 35361443, 15400867, 61950932). Additionally, there is evidence to suggest this variant may predispose to fast-growing melanomas (PMID: 67124580).   Studies showed an overrepresentation of renal cell carcinoma in individuals with this variant (PMID: 99833825, 05397673, 41937902, 40973532, 99242683 ); however, the studies were performed on relatively small patient populations and these findings have not been independently replicated. Therefore, the risk for renal cancer in individuals with the MITF E318K variant is currently unknown.  Management: While there remains lack  of a clear consensus on dermatologic management and surveillance guidelines for individuals at increased risk for melanoma, heightened screening may result in early detection and removal of cutaneous lesions at premalignant or early stages, associated with a more favorable prognosis (PMID: 41962229).  There are currently no published medical management guidelines for individuals at increased cancer risk due to a MITF mutation. We expect that medical management guidelines for the MITF gene may become available over time, thus we encouraged Carly Patrick to contact us regularly for any updates. While there are no established screening or surveillance guidelines for individuals with the pathogenic E318K variant in MITF, the following recommendations have been suggested (PMID: 79892119, 41740814):   Skin Cancer Screening and Risk Reduction: Regular skin self-examinations Individuals should notify their physicians of any changes to moles such as increasing in size, darkening in color, or other change in appearance. Regular skin examinations  by a dermatologist    Kidney Cancer Screening: Annual imaging for kidney tumors and/or regular clinical exams with a Urologist may be considered. Imaging of the kidneys may have unexpected findings and many times they are benign.    Family Members: Hereditary predisposition to cancer due to pathogenic variants in the MITF gene has autosomal dominant inheritance. This means that an individual with a pathogenic variant has a 50% chance of passing the condition on to his/her offspring. Identification of a pathogenic variant allows for the recognition of at-risk relatives who can pursue testing for the familial variant.   PLAN: After considering the risks, benefits, and limitations, Carly Patrick provided informed consent to pursue genetic testing and the blood sample was sent to [facility] for analysis of the familial MITF gene mutation. Results should be available  within approximately 2-3 weeks' time, at which point they will be disclosed by telephone to Carly Patrick, as will any additional recommendations warranted by these results. Carly Patrick will receive a summary of her genetic counseling visit and a copy of her results once available. This information will also be available in Epic.   Carly Patrick questions were answered to her satisfaction today. Our contact information was provided should additional questions or concerns arise. Thank you for the referral and allowing [place] to share in the care of your patient.   Lucille Passy, MS, [facility] Genetic Counselor [email or web address] (P) [phone]  The patient was seen for a total of 15 minutes in face-to-face genetic counseling. The patient was seen alone.  Drs. Lindi Adie and/or Burr Medico were available to discuss this case as needed.  _______________________________________________________________________ For Office Staff:  Number of people involved in session: 1 Was an Intern/ student involved with case: no

## 2021-08-29 ENCOUNTER — Encounter: Payer: Self-pay | Admitting: Genetic Counselor

## 2021-08-29 ENCOUNTER — Telehealth: Payer: Self-pay | Admitting: Genetic Counselor

## 2021-08-29 DIAGNOSIS — Z1379 Encounter for other screening for genetic and chromosomal anomalies: Secondary | ICD-10-CM | POA: Insufficient documentation

## 2021-08-29 NOTE — Telephone Encounter (Signed)
I contacted Carly Patrick to discuss her genetic testing results. Her results were Negative. She did NOT inherit the familial MITF gene mutation. Detailed clinic note to follow.  The test report has been scanned into EPIC and is located under the Molecular Pathology section of the Results Review tab.  A portion of the result report is included below for reference.   Lucille Passy, MS, [facility] Genetic Counselor [email or web address] (P) [phone]

## 2021-09-01 ENCOUNTER — Ambulatory Visit: Payer: Self-pay | Admitting: Genetic Counselor

## 2021-09-01 DIAGNOSIS — Z1379 Encounter for other screening for genetic and chromosomal anomalies: Secondary | ICD-10-CM

## 2021-09-01 NOTE — Progress Notes (Signed)
HPI:   Ms. Jaquez was previously seen in the [place] clinic due to a family history of a MITF gene mutation identified in her mother. Please refer to our prior cancer genetics clinic note for more information regarding our discussion, assessment and recommendations, at the time. Ms. Rathgeber recent genetic test results were disclosed to her, as were recommendations warranted by these results. These results and recommendations are discussed in more detail below.  CANCER HISTORY:  Oncology History   No history exists.    FAMILY HISTORY:  We obtained a detailed, 4-generation family history.  Significant diagnoses are listed below:      Family History  Problem Relation Age of Onset   Breast cancer Mother 77        genetic testing identified a mutation in MITF   Melanoma Mother          dx. 10s   Melanoma Father     Breast cancer Maternal Aunt          dx. 54s   Liver cancer Paternal Grandfather     Breast cancer Cousin 16   Kidney cancer Cousin     Breast cancer Cousin 23        reports negative genetic testing          Ms. Gosch's mother was diagnosed with melanoma in her 73s and breast cancer at age 12. She had hereditary cancer genetic testing and was found to have a MITF gene mutation. Ms. Vanbuskirk maternal aunt was diagnosed with breast cancer in her 39s, she died at 27. This aunt's son was diagnosed with kidney cancer. The daughter of a different maternal aunt was diagnosed with breast cancer at age 22. Ms. Lambe father was diagnosed with melanoma. Her paternal cousin was diagnosed with breast cancer at age 87 and reportedly had negative genetic testing. Her paternal grandfather was diagnosed with liver cancer, he is deceased.   GENETIC TEST RESULTS:  [facility] MITF site specific genetic testing was Negative. Ms. Maffeo did NOT inherit the MITF gene mutation identified in her mother.    The test report has been scanned into EPIC and is located under the  Molecular Pathology section of the Results Review tab.  A portion of the result report is included below for reference. Genetic testing reported out on 08/27/2021.        Cancer Screening Recommendations:  An individual's cancer risk and medical management are not determined by genetic test results alone. Overall cancer risk assessment incorporates additional factors, including personal medical history, family history, and any available genetic information that may result in a personalized plan for cancer prevention and surveillance. Therefore, it is recommended she continue to follow the cancer management and screening guidelines provided by her primary healthcare provider.  Based on the reported personal and family history, specific cancer screenings for Ms. Frazier Butt includes:  Breast Cancer Screening:  The Tyrer-Cuzick model is one of multiple prediction models developed to estimate an individual's lifetime risk of developing breast cancer. The Tyrer-Cuzick model is endorsed by the [employer]  (NCCN). This model includes many risk factors such as family history, endogenous estrogen exposure, and benign breast disease. The calculation is highly-dependent on the accuracy of clinical data provided by the patient and can change over time. The Tyrer-Cuzick model may be repeated to reflect new information in her personal or family history in the future.   Ms. Nethery Tyrer-Cuzick risk score was calculated to be 16.8% on 09/01/2021.  Therefore, high risk breast cancer screening is not recommended at this time. She is encouraged to contact us regarding any changes to her personal or family history, as her recommendations for screening would be altered significantly if her lifetime risk is determined to be greater than 20% based on updated information. She is encouraged to diligently follow standard screening protocols including annual mammograms, annual clinical breast  exams, and monthly breast self-exams.  Skin Cancer Screening: Regular skin self-examinations Individuals should notify their physicians of any changes to moles such as increasing in size, darkening in color, or other change in appearance. Annual skin examinations by a dermatologist  Follow sun-safety recommendations such as: Using UVA and UVB 30 SPF or higher sunscreen Avoiding sunburns Wearing protective clothing and sunglasses Avoid using tanning beds For more information about the prevention of melanoma visit [email or web address]  Recommendations for Family Members:   Since she did not inherit the familial MITF gene mutation, her children could not have inherited a mutation from her in one of these genes. Other members of the family may still carry the MITF familial mutation. Based on the family history, we recommend her siblings consider genetic counseling and testing.   Follow-Up:  Cancer genetics is a rapidly advancing field and it is possible that new genetic tests will be appropriate for her and/or her family members in the future. We encouraged her to remain in contact with cancer genetics on an annual basis so we can update her personal and family histories and let her know of advances in cancer genetics that may benefit this family.   Our contact number was provided. Ms. Kipnis questions were answered to her satisfaction, and she knows she is welcome to call us at anytime with additional questions or concerns.   Lucille Passy, MS, [facility] Genetic Counselor [email or web address] (P) [phone]

## 2021-11-03 ENCOUNTER — Other Ambulatory Visit: Payer: Self-pay | Admitting: Family Medicine

## 2021-11-03 DIAGNOSIS — Z1231 Encounter for screening mammogram for malignant neoplasm of breast: Secondary | ICD-10-CM

## 2022-01-30 ENCOUNTER — Ambulatory Visit
Admission: RE | Admit: 2022-01-30 | Discharge: 2022-01-30 | Disposition: A | Discharge status: Home / Self Care | Payer: BC Managed Care – PPO | Source: Ambulatory Visit | Attending: Family Medicine | Admitting: Family Medicine

## 2022-01-30 DIAGNOSIS — Z1231 Encounter for screening mammogram for malignant neoplasm of breast: Secondary | ICD-10-CM

## 2022-10-06 ENCOUNTER — Other Ambulatory Visit: Payer: Self-pay | Admitting: Family Medicine

## 2022-10-06 DIAGNOSIS — Z1231 Encounter for screening mammogram for malignant neoplasm of breast: Secondary | ICD-10-CM

## 2022-11-11 ENCOUNTER — Ambulatory Visit: Payer: BC Managed Care – PPO | Admitting: Podiatry

## 2022-11-11 ENCOUNTER — Encounter: Payer: Self-pay | Admitting: Podiatry

## 2022-11-11 DIAGNOSIS — L600 Ingrowing nail: Secondary | ICD-10-CM | POA: Diagnosis not present

## 2022-11-11 NOTE — Progress Notes (Signed)
Subjective:   Patient ID: Carly Patrick, female   DOB: 62 y.o.   MRN: [id number]   HPI Patient presents stating that she has had a significant problem with the big toenail left that it has been giving her pain and she damaged it several months ago with discoloration also of the right big toe.  Patient states that sore and does not remember specific injury but it appears that way.  Patient does not smoke likes to be active   Review of Systems  All other systems reviewed and are negative.       Objective:  Physical Exam Vitals and nursing note reviewed.  Constitutional:      Appearance: She is well-developed.  Pulmonary:     Effort: Pulmonary effort is normal.  Musculoskeletal:        General: Normal range of motion.  Skin:    General: Skin is warm.  Neurological:     Mental Status: She is alert.     Neurovascular status intact muscle strength adequate range of motion within normal limits with patient found to have thickness and deformity of the left over right hallux nail with the left 1 being painful both the medial and lateral sides.  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  I did explain procedure risk patient wants to have this fixed and I allowed her to read consent form and signing understanding risk.  Today I went ahead and I infiltrated the left big toe 60 mg Xylocaine Marcaine mixture sterile prep done and using sterile instrumentation remove the hallux nail exposed matrix applied phenol for applications 30 seconds followed by alcohol sterile dressing gave instructions on soaks and to leave dressing on 24 hours take it off earlier if throbbing were to occur and will come in if any issues were to occur

## 2022-11-11 NOTE — Patient Instructions (Addendum)

## 2023-02-03 ENCOUNTER — Ambulatory Visit
Admission: RE | Admit: 2023-02-03 | Discharge: 2023-02-03 | Disposition: A | Discharge status: Home / Self Care | Payer: BC Managed Care – PPO | Source: Ambulatory Visit | Attending: Family Medicine | Admitting: Family Medicine

## 2023-02-03 DIAGNOSIS — Z1231 Encounter for screening mammogram for malignant neoplasm of breast: Secondary | ICD-10-CM

## 2023-02-17 IMAGING — MG MM DIGITAL SCREENING BILAT W/ TOMO AND CAD
6 of 12 series · 6 of 36 positions shown · non-contrast
Comparison: Previous exam(s).

CLINICAL DATA: Screening.

EXAM:
DIGITAL SCREENING BILATERAL MAMMOGRAM WITH TOMOSYNTHESIS AND CAD
TECHNIQUE: Bilateral screening digital craniocaudal and mediolateral oblique
mammograms were obtained. Bilateral screening digital breast
tomosynthesis was performed. The images were evaluated with
computer-aided detection.

[L CC synth-2D (1 of 2)]
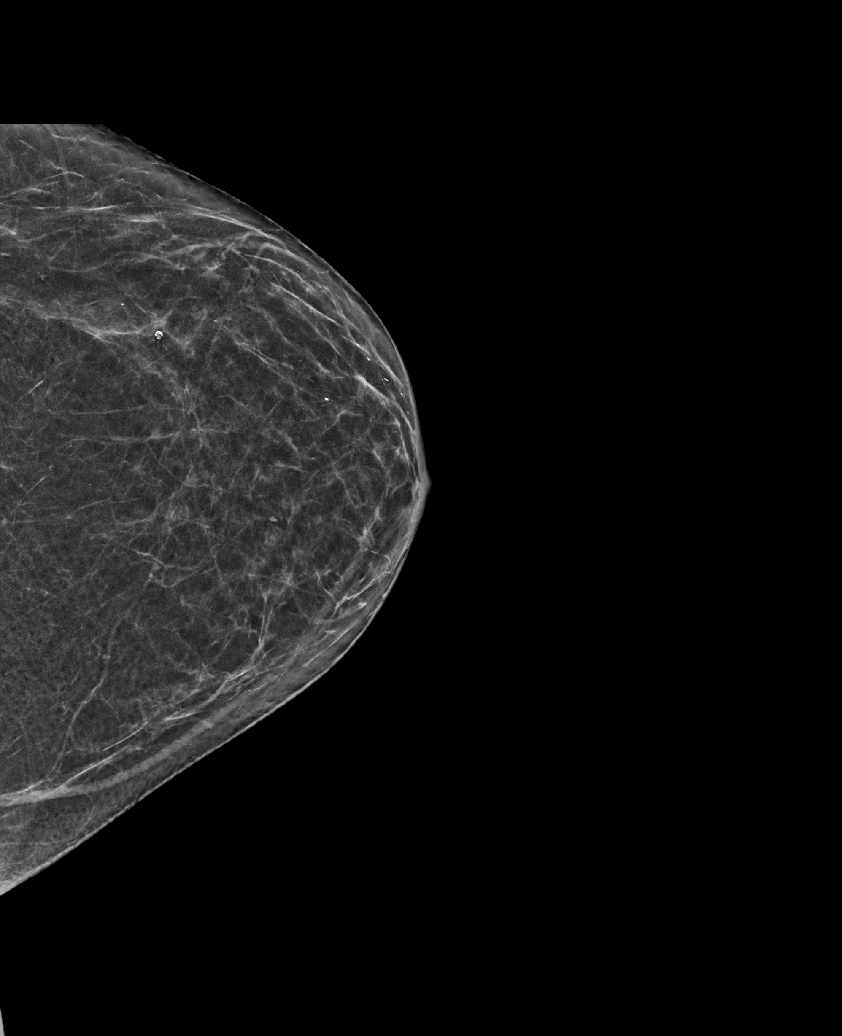

[R CC synth-2D (1 of 2)]
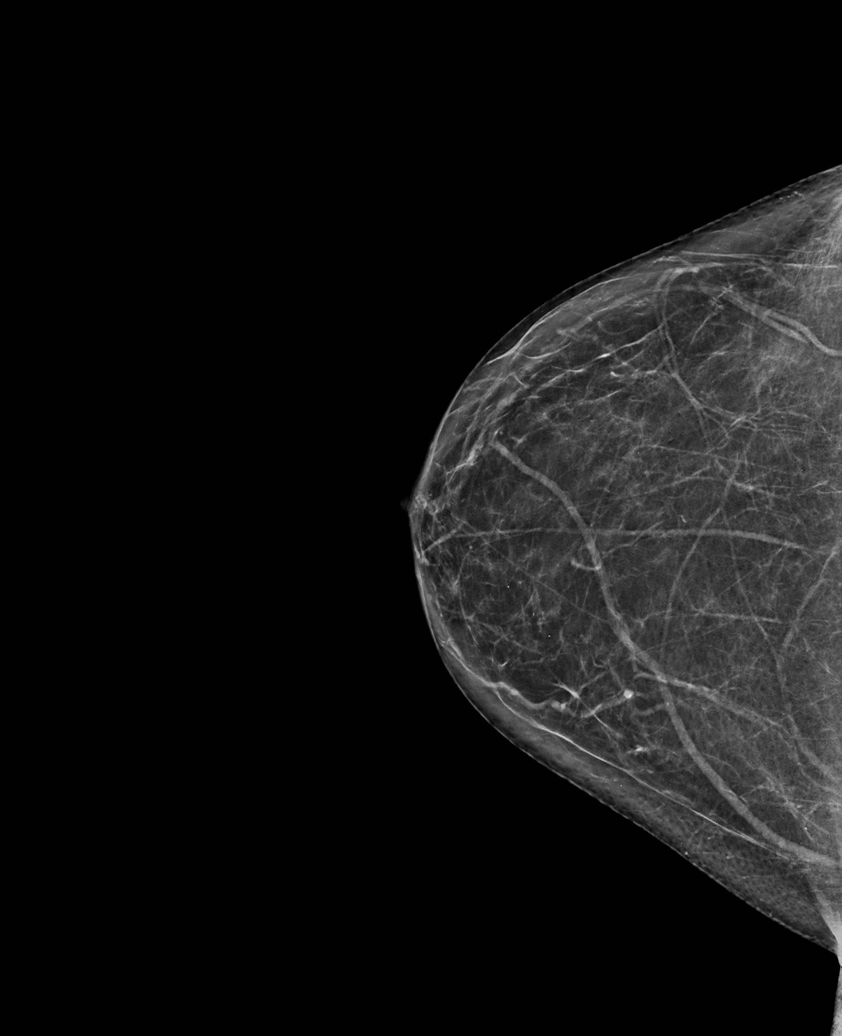

[R MLO synth-2D]
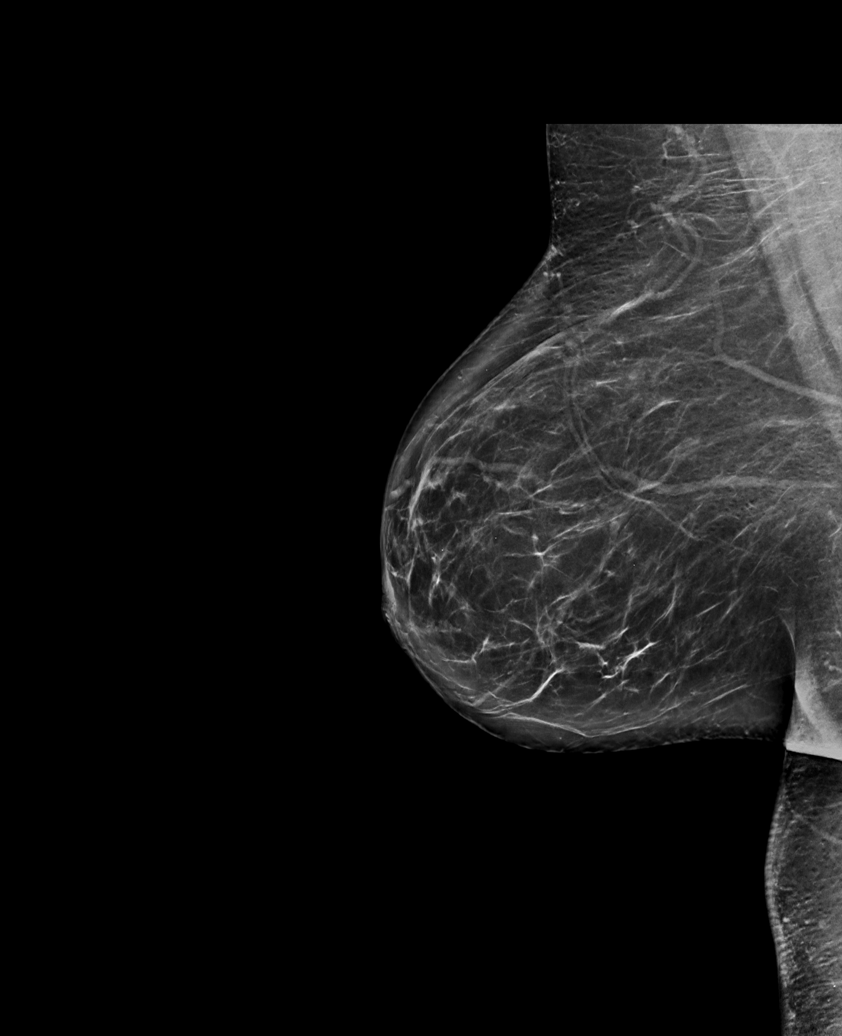

[L MLO synth-2D]
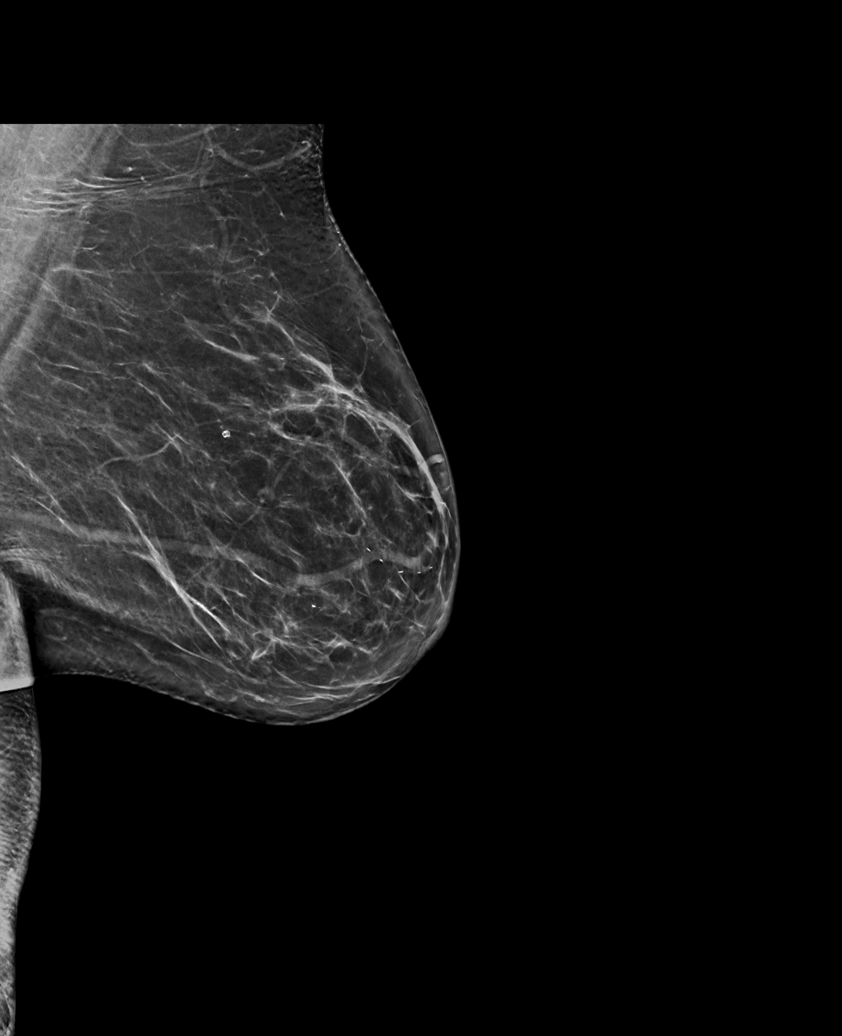

[L CC synth-2D (2 of 2)]
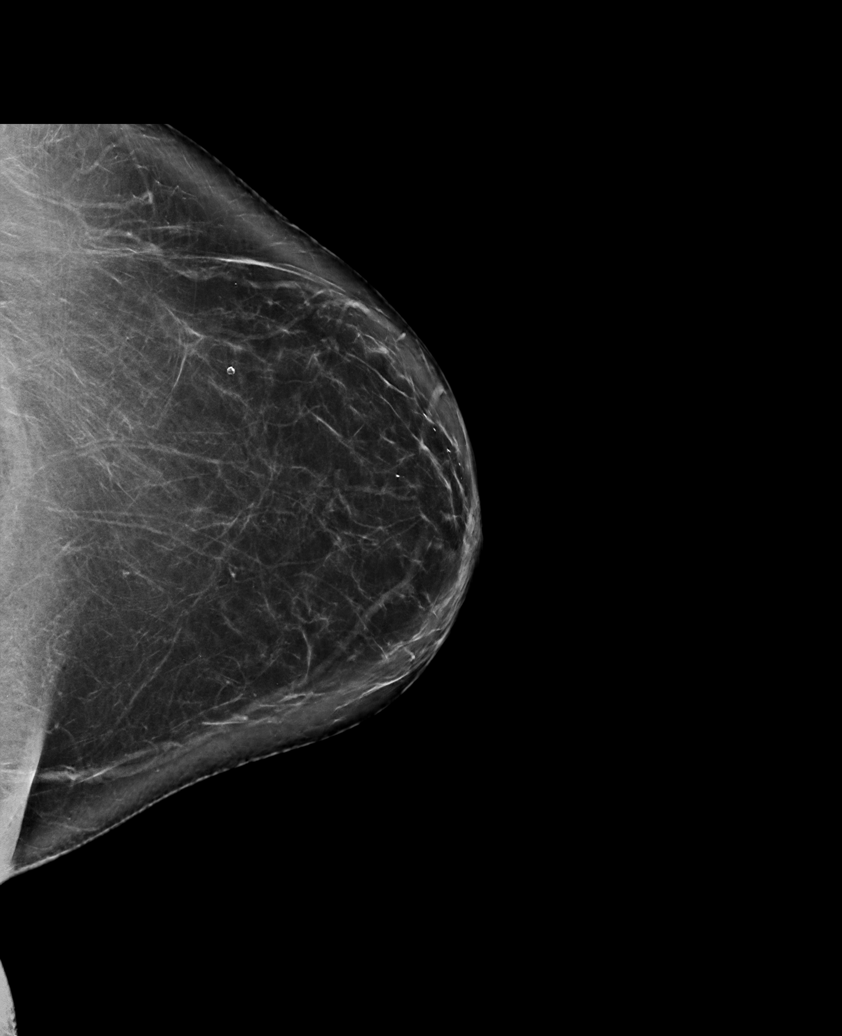

[R CC synth-2D (2 of 2)]
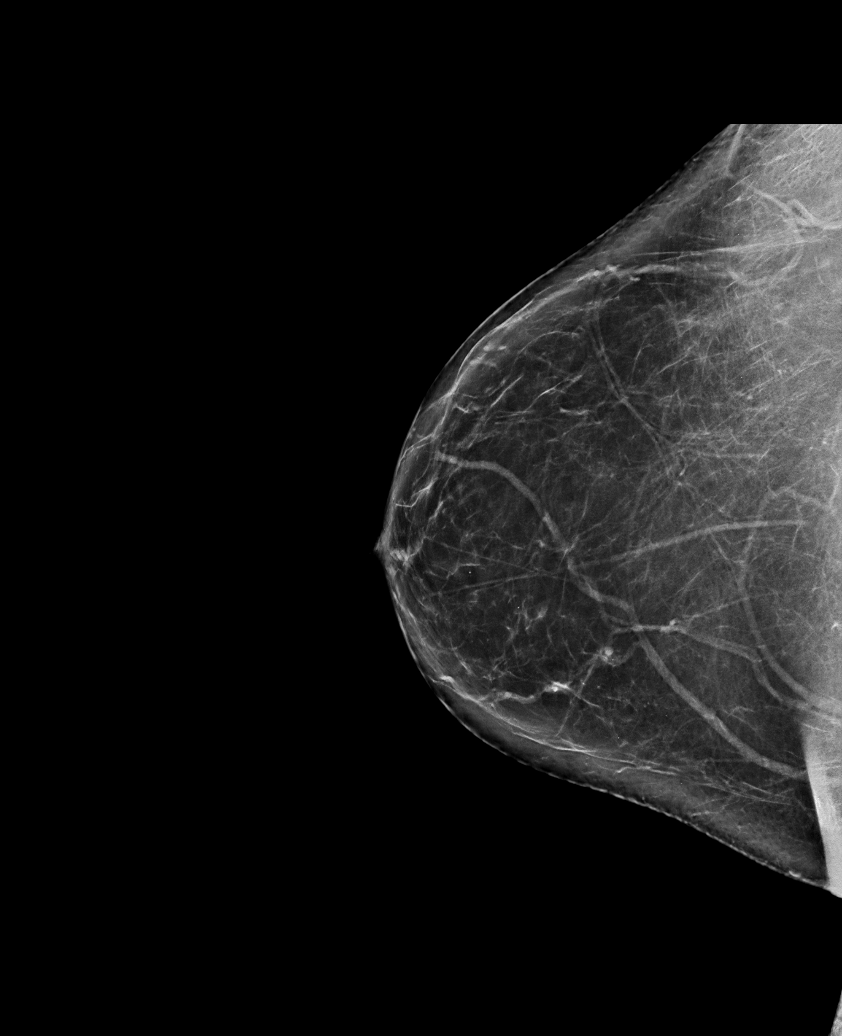

[6 of 36 positions shown; findings below may reference images not displayed]

ACR Breast Density Category b: There are scattered areas of
fibroglandular density.
FINDINGS: There are no findings suspicious for malignancy.
IMPRESSION: No mammographic evidence of malignancy. A result letter of this
screening mammogram will be mailed directly to the patient.

RECOMMENDATION:
Screening mammogram in one year. (Code:51-O-LD2)

BI-RADS CATEGORY  1: Negative.

## 2023-10-29 ENCOUNTER — Other Ambulatory Visit: Payer: Self-pay | Admitting: Family Medicine

## 2023-10-29 DIAGNOSIS — Z1231 Encounter for screening mammogram for malignant neoplasm of breast: Secondary | ICD-10-CM

## 2024-02-07 ENCOUNTER — Ambulatory Visit: Payer: Self-pay

## 2024-03-01 ENCOUNTER — Ambulatory Visit
Admission: RE | Admit: 2024-03-01 | Discharge: 2024-03-01 | Disposition: A | Discharge status: Home / Self Care | Payer: Self-pay | Source: Ambulatory Visit | Attending: Family Medicine | Admitting: Family Medicine

## 2024-03-01 ENCOUNTER — Ambulatory Visit: Payer: Self-pay

## 2024-03-01 DIAGNOSIS — Z1231 Encounter for screening mammogram for malignant neoplasm of breast: Secondary | ICD-10-CM
